# Patient Record
Sex: Male | Born: 1960 | Race: White | Hispanic: No | Marital: Single | Smoking: Never smoker
Health system: Southern US, Community
[De-identification: ages and names within clinical notes are randomized; demographics above are authoritative.]

## PROBLEM LIST (undated history)

## (undated) DIAGNOSIS — I10 Essential (primary) hypertension: Secondary | ICD-10-CM

## (undated) HISTORY — DX: Essential (primary) hypertension: I10

---

## 1997-07-16 ENCOUNTER — Encounter: Admission: RE | Admit: 1997-07-16 | Discharge: 1997-07-16 | Payer: Self-pay | Admitting: Internal Medicine

## 1997-09-08 ENCOUNTER — Encounter: Admission: RE | Admit: 1997-09-08 | Discharge: 1997-09-08 | Payer: Self-pay | Admitting: Internal Medicine

## 1997-12-14 ENCOUNTER — Ambulatory Visit (HOSPITAL_COMMUNITY): Admission: RE | Admit: 1997-12-14 | Discharge: 1997-12-14 | Payer: Self-pay | Admitting: Internal Medicine

## 1997-12-14 ENCOUNTER — Encounter: Admission: RE | Admit: 1997-12-14 | Discharge: 1997-12-14 | Payer: Self-pay | Admitting: Internal Medicine

## 1998-02-08 ENCOUNTER — Encounter: Admission: RE | Admit: 1998-02-08 | Discharge: 1998-02-08 | Payer: Self-pay | Admitting: Internal Medicine

## 1998-02-15 ENCOUNTER — Encounter: Admission: RE | Admit: 1998-02-15 | Discharge: 1998-02-15 | Payer: Self-pay | Admitting: Hematology and Oncology

## 1999-11-11 ENCOUNTER — Emergency Department (HOSPITAL_COMMUNITY): Admission: EM | Admit: 1999-11-11 | Discharge: 1999-11-11 | Payer: Self-pay | Admitting: Emergency Medicine

## 2001-01-30 ENCOUNTER — Encounter: Admission: RE | Admit: 2001-01-30 | Discharge: 2001-01-30 | Payer: Self-pay | Admitting: Internal Medicine

## 2001-02-28 ENCOUNTER — Encounter: Admission: RE | Admit: 2001-02-28 | Discharge: 2001-02-28 | Payer: Self-pay | Admitting: Internal Medicine

## 2001-06-10 ENCOUNTER — Emergency Department (HOSPITAL_COMMUNITY): Admission: EM | Admit: 2001-06-10 | Discharge: 2001-06-10 | Payer: Self-pay | Admitting: Emergency Medicine

## 2002-05-20 ENCOUNTER — Encounter: Admission: RE | Admit: 2002-05-20 | Discharge: 2002-05-20 | Payer: Self-pay | Admitting: Internal Medicine

## 2006-09-26 ENCOUNTER — Encounter: Admission: RE | Admit: 2006-09-26 | Discharge: 2006-09-26 | Payer: Self-pay | Admitting: Orthopedic Surgery

## 2010-08-02 ENCOUNTER — Other Ambulatory Visit: Payer: Self-pay | Admitting: Family Medicine

## 2010-08-02 ENCOUNTER — Ambulatory Visit
Admission: RE | Admit: 2010-08-02 | Discharge: 2010-08-02 | Disposition: A | Discharge status: Home / Self Care | Payer: Federal, State, Local not specified - PPO | Source: Ambulatory Visit | Attending: Family Medicine | Admitting: Family Medicine

## 2010-08-02 DIAGNOSIS — M79669 Pain in unspecified lower leg: Secondary | ICD-10-CM

## 2013-10-07 ENCOUNTER — Encounter (INDEPENDENT_AMBULATORY_CARE_PROVIDER_SITE_OTHER): Payer: Self-pay | Admitting: General Surgery

## 2013-10-16 ENCOUNTER — Ambulatory Visit (INDEPENDENT_AMBULATORY_CARE_PROVIDER_SITE_OTHER): Payer: Federal, State, Local not specified - PPO | Admitting: General Surgery

## 2013-10-16 ENCOUNTER — Encounter (INDEPENDENT_AMBULATORY_CARE_PROVIDER_SITE_OTHER): Payer: Self-pay | Admitting: General Surgery

## 2013-10-16 VITALS — BP 129/84 | HR 86 | Temp 97.7°F | Resp 16 | Ht 72.0 in | Wt 186.6 lb

## 2013-10-16 DIAGNOSIS — K429 Umbilical hernia without obstruction or gangrene: Secondary | ICD-10-CM

## 2013-10-16 NOTE — Progress Notes (Signed)
Patient ID: Dave Lyons, male   DOB: 03/16/1961, 53 y.o.   MRN: [id number]  Chief Complaint  Patient presents with  . Hernia    HPI Dave Lyons is a 53 y.o. male.  He is referred by Dr. Johnn HaiSharon Walters for evaluation of an umbilical hernia.   This gentleman is very fit. Works out regularly. No major medical problems. He and his girlfriend noticed a bulge at his umbilicus and he can feel it squishing it out although bit. No pain. No prior history of hernia. He was fearful that something bad what happened to him  He discussed this with his sister who [place] an M[place]D. [place]. HPI  Past Medical History  Diagnosis Date  . Hypertension     History reviewed. No pertinent past surgical history.  History reviewed. No pertinent family history.  Social History History  Substance Use Topics  . Smoking status: Never Smoker   . Smokeless tobacco: Not on file  . Alcohol Use: Yes     Comment: 11/week beer    No Known Allergies  Current Outpatient Prescriptions  Medication Sig Dispense Refill  . b complex vitamins tablet Take 1 tablet by mouth daily.      . Multiple Vitamin (MULTIVITAMIN) LIQD Take 5 mLs by mouth daily.      . Omega-3 Fatty Acids (FISH OIL) 1000 MG CAPS Take by mouth.       No current facility-administered medications for this visit.    Review of Systems Review of Systems  Constitutional: Negative for fever, chills and unexpected weight change.  HENT: Negative for congestion, hearing loss, sore throat, trouble swallowing and voice change.   Eyes: Negative for visual disturbance.  Respiratory: Negative for cough and wheezing.   Cardiovascular: Negative for chest pain, palpitations and leg swelling.  Gastrointestinal: Negative for nausea, vomiting, abdominal pain, diarrhea, constipation, blood in stool, abdominal distention, anal bleeding and rectal pain.  Genitourinary: Negative for hematuria and difficulty urinating.  Musculoskeletal: Negative for  arthralgias.  Skin: Negative for rash and wound.  Neurological: Negative for seizures, syncope, weakness and headaches.  Hematological: Negative for adenopathy. Does not bruise/bleed easily.  Psychiatric/Behavioral: Negative for confusion.    Blood pressure 129/84, pulse 86, temperature 97.7 F (36.5 C), temperature source Temporal, resp. rate 16, height 6' (1.829 m), weight 186 lb 9.6 oz (84.641 kg).  Physical Exam Physical Exam  Constitutional: He is oriented to person, place, and time. He appears well-developed and well-nourished. No distress.  Healthy. Appears fit for his age.  HENT:  Head: Normocephalic.  Nose: Nose normal.  Mouth/Throat: No oropharyngeal exudate.  Eyes: Conjunctivae and EOM are normal. Pupils are equal, round, and reactive to light. Right eye exhibits no discharge. Left eye exhibits no discharge. No scleral icterus.  Neck: Normal range of motion. Neck supple. No JVD present. No tracheal deviation present. No thyromegaly present.  Cardiovascular: Normal rate, regular rhythm, normal heart sounds and intact distal pulses.   No murmur heard. Pulmonary/Chest: Effort normal and breath sounds normal. No stridor. No respiratory distress. He has no wheezes. He has no rales. He exhibits no tenderness.  Abdominal: Soft. Bowel sounds are normal. He exhibits no distension and no mass. There is no tenderness. There is no rebound and no guarding.  Tiny umbilical hernia. Protrusion superiorly. Partially reducible. Defect 1 cm or less. Skin healthy.  Musculoskeletal: Normal range of motion. He exhibits no edema and no tenderness.  Lymphadenopathy:    He has no cervical adenopathy.  Neurological: He  is alert and oriented to person, place, and time. He has normal reflexes. Coordination normal.  Skin: Skin is warm and dry. No rash noted. He is not diaphoretic. No erythema. No pallor.  Psychiatric: He has a normal mood and affect. His behavior is normal. Judgment and thought content  normal.    Data Reviewed Office notes from needle at [facility]  Assessment    Asymptomatic, partially incarcerated umbilical hernia.     Plan    We had a long talk about the anatomy of his hernia, the likelihood that there is no immediate danger, the natural history of progression, usually slow and gradual, much less likely sudden obstruction.  He is not interested in having this repaired at this time, which is very reasonable.  I told him to return to discuss repair if he develops enlargement or pain.        Judith Campillo M 10/16/2013, 3:05 PM

## 2013-10-16 NOTE — Patient Instructions (Signed)
You have a very tiny umbilical hernia with a small amount of fatty tissue protruding through this. I can partially push this fatty tissue backing out.  You are not having any pain. You are not in any immediate danger.  I would advise you to have the hernia repaired when it becomes larger or becomes painful.  Return to see Dr. Derrell Lolling as needed or if the hernia progresses.      Umbilical Herniorrhaphy Herniorrhaphy is surgery to repair a hernia. A hernia is the protrusion of a part of an organ through an abdominal opening. An umbilical hernia means that your hernia is in the area around your navel. If the hernia is not repaired, the gap could get bigger. Your intestines or other tissues, such as fat, could get trapped in the gap. This can lead to other health problems, such as blocked intestines. If the hernia is fixed before problems set in, you may be allowed to go home the same day as the surgery (outpatient). LET YOUR CAREGIVER KNOW ABOUT:  Allergies to food or medicine.  Medicines taken, including vitamins, herbs, eyedrops, over-the-counter medicines, and creams.  Use of steroids (by mouth or creams).  Previous problems with anesthetics or numbing medicines.  History of bleeding problems or blood clots.  Previous surgery.  Other health problems, including diabetes and kidney problems.  Possibility of pregnancy, if this applies. RISKS AND COMPLICATIONS  Pain.  Excessive bleeding.  Hematoma. This is a pocket of blood that collects under the surgery site.  Infection at the surgery site.  Numbness at the surgery site.  Swelling and bruising.  Blood clots.  Intestinal damage (rare).  Scarring.  Skin damage.  Development of another hernia. This may require another surgery. BEFORE THE PROCEDURE  Ask your caregiver about changing or stopping your regular medicines. You may need to stop taking aspirin, nonsteroidal anti-inflammatory drugs (NSAIDs), vitamin E, and  blood thinners as early as 2 weeks before the procedure.  Do not eat or drink for 8 hours before the procedure, or as directed by your caregiver.  You might be asked to shower or wash with an antibacterial soap before the procedure.  Wear comfortable clothes that will be easy to put on after the procedure. PROCEDURE You will be given an intravenous (IV) tube. A needle will be inserted in your arm. Medicine will flow directly into your body through this needle. You might be given medicine to help you relax (sedative). You will be given medicine that numbs the area (local anesthetic) or medicine that makes you sleep (general anesthetic). If you have open surgery:  The surgeon will make a cut (incision) in your abdomen.  The gap in the muscle wall will be repaired. The surgeon may sew the edges together over the gap or use a mesh material to strengthen the area. When mesh is used, the body grows new, strong tissue into and around it. This new tissue closes the gap.  A drain might be put in to remove excess fluid from the body after surgery.  The surgeon will close the incision with stitches, glue, or staples. If you have laparoscopic surgery:  The surgeon will make several small incisions in your abdomen.  A thin, lighted tube (laparoscope) will be inserted into the abdomen through an incision. A camera is attached to the laparoscope that allows the surgeon to see inside the abdomen.  Tools will be inserted through the other incisions to repair the hernia. Usually, mesh is used to cover the  gap.  The surgeon will close the incisions with stitches. AFTER THE PROCEDURE  You will be taken to a recovery area. A nurse will watch and check your progress.  When you are awake, feeling well, and taking fluids well, you may be allowed to go home. In some cases, you may need to stay overnight in the hospital.  Arrange for someone to drive you home. Document Released: 06/22/2008 Document Revised:  09/25/2011 Document Reviewed: 06/27/2011 [facility] Patient Information 2015 [employer] [place]. This information is not intended to replace advice given to you by your health care provider. Make sure you discuss any questions you have with your health care provider.

## 2013-10-22 ENCOUNTER — Ambulatory Visit (INDEPENDENT_AMBULATORY_CARE_PROVIDER_SITE_OTHER): Payer: Federal, State, Local not specified - PPO | Admitting: General Surgery

## 2013-11-05 ENCOUNTER — Ambulatory Visit (INDEPENDENT_AMBULATORY_CARE_PROVIDER_SITE_OTHER): Payer: Federal, State, Local not specified - PPO | Admitting: General Surgery

## 2017-06-12 DIAGNOSIS — R52 Pain, unspecified: Secondary | ICD-10-CM | POA: Diagnosis not present

## 2017-06-12 DIAGNOSIS — J111 Influenza due to unidentified influenza virus with other respiratory manifestations: Secondary | ICD-10-CM | POA: Diagnosis not present

## 2017-11-07 DIAGNOSIS — Z0000 Encounter for general adult medical examination without abnormal findings: Secondary | ICD-10-CM | POA: Diagnosis not present

## 2017-11-07 DIAGNOSIS — E782 Mixed hyperlipidemia: Secondary | ICD-10-CM | POA: Diagnosis not present

## 2017-11-07 DIAGNOSIS — Z125 Encounter for screening for malignant neoplasm of prostate: Secondary | ICD-10-CM | POA: Diagnosis not present

## 2018-01-15 DIAGNOSIS — E785 Hyperlipidemia, unspecified: Secondary | ICD-10-CM | POA: Diagnosis not present

## 2018-03-02 DIAGNOSIS — S93401A Sprain of unspecified ligament of right ankle, initial encounter: Secondary | ICD-10-CM | POA: Diagnosis not present

## 2018-12-30 DIAGNOSIS — E785 Hyperlipidemia, unspecified: Secondary | ICD-10-CM | POA: Diagnosis not present

## 2018-12-30 DIAGNOSIS — Z125 Encounter for screening for malignant neoplasm of prostate: Secondary | ICD-10-CM | POA: Diagnosis not present

## 2018-12-30 DIAGNOSIS — Z23 Encounter for immunization: Secondary | ICD-10-CM | POA: Diagnosis not present

## 2019-01-01 DIAGNOSIS — Z0000 Encounter for general adult medical examination without abnormal findings: Secondary | ICD-10-CM | POA: Diagnosis not present

## 2019-01-08 DIAGNOSIS — Z23 Encounter for immunization: Secondary | ICD-10-CM | POA: Diagnosis not present

## 2019-02-17 DIAGNOSIS — L738 Other specified follicular disorders: Secondary | ICD-10-CM | POA: Diagnosis not present

## 2019-02-17 DIAGNOSIS — D210 Benign neoplasm of connective and other soft tissue of head, face and neck: Secondary | ICD-10-CM | POA: Diagnosis not present

## 2019-02-17 DIAGNOSIS — D485 Neoplasm of uncertain behavior of skin: Secondary | ICD-10-CM | POA: Diagnosis not present

## 2019-02-17 DIAGNOSIS — L573 Poikiloderma of Civatte: Secondary | ICD-10-CM | POA: Diagnosis not present

## 2019-03-13 DIAGNOSIS — Z1159 Encounter for screening for other viral diseases: Secondary | ICD-10-CM | POA: Diagnosis not present

## 2019-03-18 DIAGNOSIS — K5730 Diverticulosis of large intestine without perforation or abscess without bleeding: Secondary | ICD-10-CM | POA: Diagnosis not present

## 2019-03-18 DIAGNOSIS — K635 Polyp of colon: Secondary | ICD-10-CM | POA: Diagnosis not present

## 2019-03-18 DIAGNOSIS — Z1211 Encounter for screening for malignant neoplasm of colon: Secondary | ICD-10-CM | POA: Diagnosis not present

## 2019-03-18 DIAGNOSIS — Z8371 Family history of colonic polyps: Secondary | ICD-10-CM | POA: Diagnosis not present

## 2019-03-18 DIAGNOSIS — K5140 Inflammatory polyps of colon without complications: Secondary | ICD-10-CM | POA: Diagnosis not present

## 2019-04-30 DIAGNOSIS — M545 Low back pain: Secondary | ICD-10-CM | POA: Diagnosis not present

## 2019-05-07 DIAGNOSIS — M545 Low back pain: Secondary | ICD-10-CM | POA: Diagnosis not present

## 2019-05-12 DIAGNOSIS — Z23 Encounter for immunization: Secondary | ICD-10-CM | POA: Diagnosis not present

## 2019-09-02 DIAGNOSIS — S80861A Insect bite (nonvenomous), right lower leg, initial encounter: Secondary | ICD-10-CM | POA: Diagnosis not present

## 2019-09-02 DIAGNOSIS — L740 Miliaria rubra: Secondary | ICD-10-CM | POA: Diagnosis not present

## 2019-10-07 DIAGNOSIS — L738 Other specified follicular disorders: Secondary | ICD-10-CM | POA: Diagnosis not present

## 2019-10-07 DIAGNOSIS — L821 Other seborrheic keratosis: Secondary | ICD-10-CM | POA: Diagnosis not present

## 2019-10-07 DIAGNOSIS — D225 Melanocytic nevi of trunk: Secondary | ICD-10-CM | POA: Diagnosis not present

## 2020-01-21 DIAGNOSIS — E782 Mixed hyperlipidemia: Secondary | ICD-10-CM | POA: Diagnosis not present

## 2020-01-21 DIAGNOSIS — Z125 Encounter for screening for malignant neoplasm of prostate: Secondary | ICD-10-CM | POA: Diagnosis not present

## 2020-01-21 DIAGNOSIS — Z23 Encounter for immunization: Secondary | ICD-10-CM | POA: Diagnosis not present

## 2020-01-21 DIAGNOSIS — Z0000 Encounter for general adult medical examination without abnormal findings: Secondary | ICD-10-CM | POA: Diagnosis not present

## 2020-03-21 DIAGNOSIS — J22 Unspecified acute lower respiratory infection: Secondary | ICD-10-CM | POA: Diagnosis not present

## 2020-03-21 DIAGNOSIS — U071 COVID-19: Secondary | ICD-10-CM | POA: Diagnosis not present

## 2020-03-22 DIAGNOSIS — J22 Unspecified acute lower respiratory infection: Secondary | ICD-10-CM | POA: Diagnosis not present

## 2020-03-22 DIAGNOSIS — U071 COVID-19: Secondary | ICD-10-CM | POA: Diagnosis not present

## 2020-03-25 ENCOUNTER — Telehealth: Payer: Self-pay | Admitting: Physician Assistant

## 2020-03-25 NOTE — Telephone Encounter (Signed)
Called to discuss with Dave Lyons about Covid symptoms and the use of  monoclonal antibody infusion for those with mild to moderate Covid symptoms and at a high risk of hospitalization.     Pt is not qualified for this infusion due to lack of identified risk factors and co-morbid conditions.  Symptoms reviewed as well as criteria for ending isolation.  Symptoms reviewed that would warrant ED/Hospital evaluation as well should her condition worsen. Preventative practices reviewed. Patient verbalized understanding.      Patient Active Problem List   Diagnosis Date Noted  . Umbilical hernia without obstruction and without gangrene 10/16/2013     Manson Passey, PA

## 2020-03-30 DIAGNOSIS — Z1152 Encounter for screening for COVID-19: Secondary | ICD-10-CM | POA: Diagnosis not present

## 2020-03-30 DIAGNOSIS — Z03818 Encounter for observation for suspected exposure to other biological agents ruled out: Secondary | ICD-10-CM | POA: Diagnosis not present

## 2020-05-26 DIAGNOSIS — R002 Palpitations: Secondary | ICD-10-CM | POA: Diagnosis not present

## 2020-05-26 DIAGNOSIS — R519 Headache, unspecified: Secondary | ICD-10-CM | POA: Diagnosis not present

## 2020-05-26 DIAGNOSIS — Z8616 Personal history of COVID-19: Secondary | ICD-10-CM | POA: Diagnosis not present

## 2020-05-26 DIAGNOSIS — I10 Essential (primary) hypertension: Secondary | ICD-10-CM | POA: Diagnosis not present

## 2020-06-01 ENCOUNTER — Ambulatory Visit (INDEPENDENT_AMBULATORY_CARE_PROVIDER_SITE_OTHER): Payer: Federal, State, Local not specified - PPO | Admitting: Nurse Practitioner

## 2020-06-01 VITALS — BP 139/80 | HR 85 | Temp 97.5°F | Resp 18 | Ht 72.0 in | Wt 196.0 lb

## 2020-06-01 DIAGNOSIS — F419 Anxiety disorder, unspecified: Secondary | ICD-10-CM

## 2020-06-01 DIAGNOSIS — Z8616 Personal history of COVID-19: Secondary | ICD-10-CM

## 2020-06-01 DIAGNOSIS — R000 Tachycardia, unspecified: Secondary | ICD-10-CM | POA: Diagnosis not present

## 2020-06-01 DIAGNOSIS — R002 Palpitations: Secondary | ICD-10-CM | POA: Diagnosis not present

## 2020-06-01 DIAGNOSIS — I499 Cardiac arrhythmia, unspecified: Secondary | ICD-10-CM

## 2020-06-01 MED ORDER — HYDROXYZINE HCL 10 MG PO TABS: 10.0000 mg | ORAL_TABLET | Freq: Three times a day (TID) | ORAL | 0 refills | Status: DC | PRN

## 2020-06-01 NOTE — Patient Instructions (Signed)
History of Covid 19 Tachycardia Elevated blood pressure Palpitations Headaches Irregular heart rhythm:   Stay well hydrated  Stay active  Deep breathing exercises  May start vitamin C daily, vitamin D3 daily, Zinc daily  May take tylenol or fever or pain  May take mucinex twice daily  Will order atarax PRN for anxiety  May trial melatonin 1 hour before bedtime  Will place referral to EP - further evaluation - concerned for POTS     Follow up:  Follow up in 2 weeks or sooner if needed

## 2020-06-01 NOTE — Progress Notes (Signed)
Patient has taken supplements today and has eaten lunch. Patient denies pain at this time. Patient complains of elevated BP since contracting COVID. Patient denies HA, body aches. Patient denies N/V/diarrhea. Patient fevers or low appetite.

## 2020-06-01 NOTE — Progress Notes (Signed)
@Patient  ID: , male    DOB: April 16, 1960, 60 y.o.   MRN: [id number]  Chief Complaint  Patient presents with  . Covid Positive    Long hauler concerns relating to BP    Referring provider: No ref. provider found  HPI  Patient presents today for post COVID care clinic visit.  Patient tested positive for Covid on 03/19/2020.  Patient states that she is still having issues of headache, congestion, agitation, palpitations.  Patient also has concerns related to blood pressure and heart rate.  Patient did have an EKG in office today which showed normal sinus rhythm.  Upon my exam patient was noted have a regular heart rhythm but this was not captured during EKG.  We discussed that patient does need further work-up with cardiology and may need a Zio patch.  Concerned for POTS and will place referral for EP. Denies f/c/s, n/v/d, hemoptysis, PND, chest pain or edema.     No Known Allergies   There is no immunization history on file for this patient.  Past Medical History:  Diagnosis Date  . Hypertension     Tobacco History: Social History   Tobacco Use  Smoking Status Never Smoker  Smokeless Tobacco Never Used   Counseling given: Not Answered   Outpatient Encounter Medications as of 06/01/2020  Medication Sig  . b complex vitamins tablet Take 1 tablet by mouth daily.  . Coenzyme Q10 (CO Q 10) 10 MG CAPS 1 capsule with a meal  . hydrOXYzine (ATARAX/VISTARIL) 10 MG tablet Take 1 tablet (10 mg total) by mouth 3 (three) times daily as needed.  . Multiple Vitamin (MULTIVITAMIN) LIQD Take 5 mLs by mouth daily.  . niacin 500 MG tablet 1 tablet with food  . Omega-3 Fatty Acids (FISH OIL) 1000 MG CAPS Take by mouth.   No facility-administered encounter medications on file as of 06/01/2020.     Review of Systems  Review of Systems  Constitutional: Negative.  Negative for fatigue and fever.  HENT: Positive for congestion.   Respiratory: Negative for cough and shortness of  breath.   Cardiovascular: Positive for palpitations.  Gastrointestinal: Negative.   Allergic/Immunologic: Negative.   Neurological: Positive for headaches.  Psychiatric/Behavioral: The patient is nervous/anxious.        Physical Exam  BP 139/80 (BP Location: Right Arm, Patient Position: Sitting, Cuff Size: Large)   Pulse 85   Temp (!) 97.5 F (36.4 C) (Oral)   Resp 18   Ht 6' (1.829 m)   Wt 196 lb (88.9 kg)   SpO2 99%   BMI 26.58 kg/m   Wt Readings from Last 5 Encounters:  06/01/20 196 lb (88.9 kg)  10/16/13 186 lb 9.6 oz (84.6 kg)     Physical Exam Vitals and nursing note reviewed.  Constitutional:      General: He is not in acute distress.    Appearance: He is well-developed and well-nourished.  Cardiovascular:     Rate and Rhythm: Normal rate. Rhythm irregular.  Pulmonary:     Effort: Pulmonary effort is normal.     Breath sounds: Normal breath sounds.  Musculoskeletal:     Right lower leg: No edema.     Left lower leg: No edema.  Skin:    General: Skin is warm and dry.  Neurological:     Mental Status: He is alert and oriented to person, place, and time.  Psychiatric:        Mood and Affect: Mood and affect and mood  normal.        Behavior: Behavior normal.      Lab Results:  CBC No results found for: WBC, RBC, HGB, HCT, PLT, MCV, MCH, MCHC, RDW, LYMPHSABS, MONOABS, EOSABS, BASOSABS  BMET No results found for: NA, K, CL, CO2, GLUCOSE, BUN, CREATININE, CALCIUM, GFRNONAA, GFRAA  BNP No results found for: BNP  ProBNP No results found for: PROBNP  Imaging: No results found.   Assessment & Plan:   History of COVID-19 Tachycardia Elevated blood pressure Palpitations Headaches Irregular heart rhythm:   Stay well hydrated  Stay active  Deep breathing exercises  May start vitamin C daily, vitamin D3 daily, Zinc daily  May take tylenol or fever or pain  May take mucinex twice daily  Will order atarax PRN for anxiety  May trial  melatonin 1 hour before bedtime  Will place referral to EP - further evaluation - concerned for POTS     Follow up:  Follow up in 2 weeks or sooner if needed      Ivonne Andrew, NP 06/02/2020

## 2020-06-02 DIAGNOSIS — R002 Palpitations: Secondary | ICD-10-CM | POA: Insufficient documentation

## 2020-06-02 DIAGNOSIS — Z8616 Personal history of COVID-19: Secondary | ICD-10-CM | POA: Insufficient documentation

## 2020-06-02 DIAGNOSIS — I499 Cardiac arrhythmia, unspecified: Secondary | ICD-10-CM | POA: Insufficient documentation

## 2020-06-02 DIAGNOSIS — F419 Anxiety disorder, unspecified: Secondary | ICD-10-CM | POA: Insufficient documentation

## 2020-06-02 DIAGNOSIS — R000 Tachycardia, unspecified: Secondary | ICD-10-CM | POA: Insufficient documentation

## 2020-06-02 NOTE — Assessment & Plan Note (Signed)
Tachycardia Elevated blood pressure Palpitations Headaches Irregular heart rhythm:   Stay well hydrated  Stay active  Deep breathing exercises  May start vitamin C daily, vitamin D3 daily, Zinc daily  May take tylenol or fever or pain  May take mucinex twice daily  Will order atarax PRN for anxiety  May trial melatonin 1 hour before bedtime  Will place referral to EP - further evaluation - concerned for POTS     Follow up:  Follow up in 2 weeks or sooner if needed

## 2020-06-09 ENCOUNTER — Telehealth: Payer: Self-pay

## 2020-06-09 NOTE — Telephone Encounter (Signed)
EKG ON FILE °

## 2020-07-13 ENCOUNTER — Ambulatory Visit: Payer: Federal, State, Local not specified - PPO | Admitting: Internal Medicine

## 2020-07-13 ENCOUNTER — Other Ambulatory Visit: Payer: Self-pay

## 2020-07-13 ENCOUNTER — Encounter: Payer: Self-pay | Admitting: Internal Medicine

## 2020-07-13 VITALS — Ht 72.0 in | Wt 199.0 lb

## 2020-07-13 DIAGNOSIS — R002 Palpitations: Secondary | ICD-10-CM | POA: Diagnosis not present

## 2020-07-13 NOTE — Patient Instructions (Addendum)
Medication Instructions:  Your physician recommends that you continue on your current medications as directed. Please refer to the Current Medication list given to you today.  Labwork: None ordered.  Testing/Procedures: None ordered.  Follow-Up: Your physician wants you to follow-up in: as needed with Lewayne Bunting, MD or one of the following Advanced Practice Providers on your designated Care Team:    Gypsy Balsam, NP  Francis Dowse, PA-C  Casimiro Needle "Mardelle Matte" Lanna Poche, [place]   Any Other Special Instructions Will Be Listed Below (If Applicable).  If you need a refill on your cardiac medications before your next appointment, please call your pharmacy.

## 2020-07-13 NOTE — Progress Notes (Signed)
HPI Dave Lyons is referred today by Dave Seller, Dave Lyons for tachycardia. He is a 60 yo man with recent Covid infection, who has had some persistent symptoms. He was noted to have an irregular heart beat in the providers office though the ECG obtained demonstrates NSR. His main complaint was that he would have a HA since his Covid diagnosis which has improved markedly in the past 2 weeks.  Allergies  Allergen Reactions  . Benadryl [Diphenhydramine] Other (See Comments)    Pt states "it makes him feel acclerated and irritated"     Current Outpatient Medications  Medication Sig Dispense Refill  . Coenzyme Q10 (CO Q 10) 10 MG CAPS 1 capsule with a meal    . Multiple Vitamin (MULTIVITAMIN) LIQD Take 5 mLs by mouth daily.    . niacin 500 MG tablet 1 tablet with food    . Omega-3 Fatty Acids (FISH OIL) 1000 MG CAPS Take 4,000 mg by mouth daily.     No current facility-administered medications for this visit.     Past Medical History:  Diagnosis Date  . Hypertension     ROS:   All systems reviewed and negative except as noted in the HPI.   No past surgical history on file.   No family history on file.   Social History   Socioeconomic History  . Marital status: Single    Spouse name: Not on file  . Number of children: Not on file  . Years of education: Not on file  . Highest education level: Not on file  Occupational History  . Not on file  Tobacco Use  . Smoking status: Never Smoker  . Smokeless tobacco: Never Used  Substance and Sexual Activity  . Alcohol use: Yes    Alcohol/week: 11.0 standard drinks    Types: 11 Cans of beer per week  . Drug use: No  . Sexual activity: Not Currently  Other Topics Concern  . Not on file  Social History Narrative  . Not on file   Social Determinants of Health   Financial Resource Strain: Not on file  Food Insecurity: Not on file  Transportation Needs: Not on file  Physical Activity: Not on file  Stress: Not on file   Social Connections: Not on file  Intimate Partner Violence: Not on file     Wt 199 lb (90.3 kg)   BMI 26.99 kg/m   Physical Exam:  Well appearing middle aged man, NAD HEENT: Unremarkable Neck:  No JVD, no thyromegally Lymphatics:  No adenopathy Back:  No CVA tenderness Lungs:  Clear with no wheezes HEART:  Regular rate rhythm, no murmurs, no rubs, no clicks Abd:  soft, positive bowel sounds, no organomegally, no rebound, no guarding Ext:  2 plus pulses, no edema, no cyanosis, no clubbing Skin:  No rashes no nodules Neuro:  CN II through XII intact, motor grossly intact  EKG - reviewed. NSR   Assess/Plan: 1. Recent Covid infection - I suspect that most her his symptoms are related to his prior Covid infection. He appears to be improving and I think watchful waiting is most appropriate. 2. Possible POTS - his bp did not drop and his HR increased less than 10 points going from sitting to standing. He does not have orthostasis today.  3. ETOH - I encouraged him to reduce his ETOH consumption down to 1 drink and at most 2 a day. 4. Caffeine use - I encouraged him to drink a single  cup of coffee a day.   Dave Jupin,MD 2.

## 2020-10-12 DIAGNOSIS — H5711 Ocular pain, right eye: Secondary | ICD-10-CM | POA: Diagnosis not present

## 2020-10-12 DIAGNOSIS — R519 Headache, unspecified: Secondary | ICD-10-CM | POA: Diagnosis not present

## 2020-10-13 DIAGNOSIS — R0981 Nasal congestion: Secondary | ICD-10-CM | POA: Diagnosis not present

## 2020-10-13 DIAGNOSIS — R519 Headache, unspecified: Secondary | ICD-10-CM | POA: Diagnosis not present

## 2020-10-13 DIAGNOSIS — H579 Unspecified disorder of eye and adnexa: Secondary | ICD-10-CM | POA: Diagnosis not present

## 2020-10-18 DIAGNOSIS — G500 Trigeminal neuralgia: Secondary | ICD-10-CM | POA: Diagnosis not present

## 2020-12-22 DIAGNOSIS — M25562 Pain in left knee: Secondary | ICD-10-CM | POA: Diagnosis not present

## 2021-01-07 DIAGNOSIS — M25562 Pain in left knee: Secondary | ICD-10-CM | POA: Diagnosis not present

## 2021-01-12 DIAGNOSIS — M25562 Pain in left knee: Secondary | ICD-10-CM | POA: Diagnosis not present

## 2021-01-30 DIAGNOSIS — E782 Mixed hyperlipidemia: Secondary | ICD-10-CM | POA: Diagnosis not present

## 2021-01-30 DIAGNOSIS — Z0000 Encounter for general adult medical examination without abnormal findings: Secondary | ICD-10-CM | POA: Diagnosis not present

## 2021-01-30 DIAGNOSIS — Z23 Encounter for immunization: Secondary | ICD-10-CM | POA: Diagnosis not present

## 2021-02-13 DIAGNOSIS — M25532 Pain in left wrist: Secondary | ICD-10-CM | POA: Diagnosis not present

## 2021-02-16 ENCOUNTER — Other Ambulatory Visit: Payer: Self-pay | Admitting: Family Medicine

## 2021-02-16 DIAGNOSIS — E782 Mixed hyperlipidemia: Secondary | ICD-10-CM

## 2021-03-06 DIAGNOSIS — M25532 Pain in left wrist: Secondary | ICD-10-CM | POA: Diagnosis not present

## 2021-03-10 ENCOUNTER — Ambulatory Visit
Admission: RE | Admit: 2021-03-10 | Discharge: 2021-03-10 | Disposition: A | Discharge status: Home / Self Care | Payer: No Typology Code available for payment source | Source: Ambulatory Visit | Attending: Family Medicine | Admitting: Family Medicine

## 2021-03-10 DIAGNOSIS — E782 Mixed hyperlipidemia: Secondary | ICD-10-CM

## 2021-03-23 DIAGNOSIS — J988 Other specified respiratory disorders: Secondary | ICD-10-CM | POA: Diagnosis not present

## 2021-03-23 DIAGNOSIS — R0981 Nasal congestion: Secondary | ICD-10-CM | POA: Diagnosis not present

## 2021-04-27 DIAGNOSIS — M6289 Other specified disorders of muscle: Secondary | ICD-10-CM | POA: Diagnosis not present

## 2021-05-04 DIAGNOSIS — M6289 Other specified disorders of muscle: Secondary | ICD-10-CM | POA: Diagnosis not present

## 2021-05-10 DIAGNOSIS — M6289 Other specified disorders of muscle: Secondary | ICD-10-CM | POA: Diagnosis not present

## 2021-05-24 DIAGNOSIS — M6289 Other specified disorders of muscle: Secondary | ICD-10-CM | POA: Diagnosis not present

## 2021-08-22 DIAGNOSIS — J0111 Acute recurrent frontal sinusitis: Secondary | ICD-10-CM | POA: Diagnosis not present

## 2021-08-22 DIAGNOSIS — R030 Elevated blood-pressure reading, without diagnosis of hypertension: Secondary | ICD-10-CM | POA: Diagnosis not present

## 2021-08-30 DIAGNOSIS — M6289 Other specified disorders of muscle: Secondary | ICD-10-CM | POA: Diagnosis not present

## 2022-01-10 DIAGNOSIS — M6289 Other specified disorders of muscle: Secondary | ICD-10-CM | POA: Diagnosis not present

## 2022-01-10 DIAGNOSIS — R293 Abnormal posture: Secondary | ICD-10-CM | POA: Diagnosis not present

## 2022-01-10 DIAGNOSIS — R2689 Other abnormalities of gait and mobility: Secondary | ICD-10-CM | POA: Diagnosis not present

## 2022-01-10 DIAGNOSIS — M5459 Other low back pain: Secondary | ICD-10-CM | POA: Diagnosis not present

## 2022-01-11 DIAGNOSIS — R2689 Other abnormalities of gait and mobility: Secondary | ICD-10-CM | POA: Diagnosis not present

## 2022-01-11 DIAGNOSIS — R293 Abnormal posture: Secondary | ICD-10-CM | POA: Diagnosis not present

## 2022-01-11 DIAGNOSIS — M6289 Other specified disorders of muscle: Secondary | ICD-10-CM | POA: Diagnosis not present

## 2022-01-11 DIAGNOSIS — M5459 Other low back pain: Secondary | ICD-10-CM | POA: Diagnosis not present

## 2022-01-24 DIAGNOSIS — R2689 Other abnormalities of gait and mobility: Secondary | ICD-10-CM | POA: Diagnosis not present

## 2022-01-24 DIAGNOSIS — M5459 Other low back pain: Secondary | ICD-10-CM | POA: Diagnosis not present

## 2022-01-24 DIAGNOSIS — R293 Abnormal posture: Secondary | ICD-10-CM | POA: Diagnosis not present

## 2022-01-24 DIAGNOSIS — M6289 Other specified disorders of muscle: Secondary | ICD-10-CM | POA: Diagnosis not present

## 2022-02-07 DIAGNOSIS — M5459 Other low back pain: Secondary | ICD-10-CM | POA: Diagnosis not present

## 2022-02-07 DIAGNOSIS — R293 Abnormal posture: Secondary | ICD-10-CM | POA: Diagnosis not present

## 2022-02-07 DIAGNOSIS — M6289 Other specified disorders of muscle: Secondary | ICD-10-CM | POA: Diagnosis not present

## 2022-02-07 DIAGNOSIS — R2689 Other abnormalities of gait and mobility: Secondary | ICD-10-CM | POA: Diagnosis not present

## 2022-02-19 DIAGNOSIS — E785 Hyperlipidemia, unspecified: Secondary | ICD-10-CM | POA: Diagnosis not present

## 2022-02-19 DIAGNOSIS — Z0000 Encounter for general adult medical examination without abnormal findings: Secondary | ICD-10-CM | POA: Diagnosis not present

## 2022-02-19 DIAGNOSIS — R7301 Impaired fasting glucose: Secondary | ICD-10-CM | POA: Diagnosis not present

## 2022-02-19 DIAGNOSIS — Z125 Encounter for screening for malignant neoplasm of prostate: Secondary | ICD-10-CM | POA: Diagnosis not present

## 2022-02-26 ENCOUNTER — Other Ambulatory Visit: Payer: Self-pay | Admitting: Family Medicine

## 2022-02-26 DIAGNOSIS — R918 Other nonspecific abnormal finding of lung field: Secondary | ICD-10-CM

## 2022-03-14 ENCOUNTER — Ambulatory Visit
Admission: RE | Admit: 2022-03-14 | Discharge: 2022-03-14 | Disposition: A | Discharge status: Home / Self Care | Payer: Federal, State, Local not specified - PPO | Source: Ambulatory Visit | Attending: Family Medicine | Admitting: Family Medicine

## 2022-03-14 DIAGNOSIS — R918 Other nonspecific abnormal finding of lung field: Secondary | ICD-10-CM

## 2022-03-14 DIAGNOSIS — J439 Emphysema, unspecified: Secondary | ICD-10-CM | POA: Diagnosis not present

## 2022-03-14 DIAGNOSIS — R911 Solitary pulmonary nodule: Secondary | ICD-10-CM | POA: Diagnosis not present

## 2022-03-20 DIAGNOSIS — J0110 Acute frontal sinusitis, unspecified: Secondary | ICD-10-CM | POA: Diagnosis not present

## 2022-05-07 DIAGNOSIS — R197 Diarrhea, unspecified: Secondary | ICD-10-CM | POA: Diagnosis not present

## 2022-05-30 ENCOUNTER — Other Ambulatory Visit: Payer: Self-pay | Admitting: Family Medicine

## 2022-05-30 DIAGNOSIS — K7460 Unspecified cirrhosis of liver: Secondary | ICD-10-CM

## 2022-06-19 ENCOUNTER — Other Ambulatory Visit (HOSPITAL_COMMUNITY): Payer: Self-pay | Admitting: Family Medicine

## 2022-06-19 DIAGNOSIS — K7460 Unspecified cirrhosis of liver: Secondary | ICD-10-CM

## 2022-07-03 ENCOUNTER — Ambulatory Visit (HOSPITAL_COMMUNITY)
Admission: RE | Admit: 2022-07-03 | Discharge: 2022-07-03 | Disposition: A | Discharge status: Home / Self Care | Payer: Federal, State, Local not specified - PPO | Source: Ambulatory Visit | Attending: Family Medicine | Admitting: Family Medicine

## 2022-07-03 DIAGNOSIS — K7460 Unspecified cirrhosis of liver: Secondary | ICD-10-CM | POA: Diagnosis not present

## 2022-07-03 DIAGNOSIS — R2689 Other abnormalities of gait and mobility: Secondary | ICD-10-CM | POA: Diagnosis not present

## 2022-07-03 DIAGNOSIS — M5459 Other low back pain: Secondary | ICD-10-CM | POA: Diagnosis not present

## 2022-07-03 DIAGNOSIS — R293 Abnormal posture: Secondary | ICD-10-CM | POA: Diagnosis not present

## 2022-07-18 DIAGNOSIS — R293 Abnormal posture: Secondary | ICD-10-CM | POA: Diagnosis not present

## 2022-07-18 DIAGNOSIS — M5459 Other low back pain: Secondary | ICD-10-CM | POA: Diagnosis not present

## 2022-07-18 DIAGNOSIS — R2689 Other abnormalities of gait and mobility: Secondary | ICD-10-CM | POA: Diagnosis not present

## 2022-08-21 DIAGNOSIS — J3489 Other specified disorders of nose and nasal sinuses: Secondary | ICD-10-CM | POA: Diagnosis not present

## 2023-02-20 DIAGNOSIS — M542 Cervicalgia: Secondary | ICD-10-CM | POA: Diagnosis not present

## 2023-02-20 DIAGNOSIS — M545 Low back pain, unspecified: Secondary | ICD-10-CM | POA: Diagnosis not present

## 2023-03-05 DIAGNOSIS — R7301 Impaired fasting glucose: Secondary | ICD-10-CM | POA: Diagnosis not present

## 2023-03-05 DIAGNOSIS — Z0000 Encounter for general adult medical examination without abnormal findings: Secondary | ICD-10-CM | POA: Diagnosis not present

## 2023-03-05 DIAGNOSIS — E785 Hyperlipidemia, unspecified: Secondary | ICD-10-CM | POA: Diagnosis not present

## 2023-03-05 DIAGNOSIS — Z23 Encounter for immunization: Secondary | ICD-10-CM | POA: Diagnosis not present

## 2023-03-12 DIAGNOSIS — M545 Low back pain, unspecified: Secondary | ICD-10-CM | POA: Diagnosis not present

## 2023-03-12 DIAGNOSIS — M542 Cervicalgia: Secondary | ICD-10-CM | POA: Diagnosis not present

## 2023-03-26 DIAGNOSIS — M542 Cervicalgia: Secondary | ICD-10-CM | POA: Diagnosis not present

## 2023-03-26 DIAGNOSIS — M545 Low back pain, unspecified: Secondary | ICD-10-CM | POA: Diagnosis not present

## 2023-04-16 DIAGNOSIS — M545 Low back pain, unspecified: Secondary | ICD-10-CM | POA: Diagnosis not present

## 2023-04-16 DIAGNOSIS — M542 Cervicalgia: Secondary | ICD-10-CM | POA: Diagnosis not present

## 2023-05-01 DIAGNOSIS — M545 Low back pain, unspecified: Secondary | ICD-10-CM | POA: Diagnosis not present

## 2023-05-01 DIAGNOSIS — M542 Cervicalgia: Secondary | ICD-10-CM | POA: Diagnosis not present

## 2023-05-07 DIAGNOSIS — M79674 Pain in right toe(s): Secondary | ICD-10-CM | POA: Diagnosis not present

## 2023-05-07 DIAGNOSIS — R030 Elevated blood-pressure reading, without diagnosis of hypertension: Secondary | ICD-10-CM | POA: Diagnosis not present

## 2023-05-08 DIAGNOSIS — M79671 Pain in right foot: Secondary | ICD-10-CM | POA: Diagnosis not present

## 2023-05-08 DIAGNOSIS — M2021 Hallux rigidus, right foot: Secondary | ICD-10-CM | POA: Diagnosis not present

## 2023-05-20 DIAGNOSIS — M545 Low back pain, unspecified: Secondary | ICD-10-CM | POA: Diagnosis not present

## 2023-05-20 DIAGNOSIS — M542 Cervicalgia: Secondary | ICD-10-CM | POA: Diagnosis not present

## 2023-05-23 DIAGNOSIS — I10 Essential (primary) hypertension: Secondary | ICD-10-CM | POA: Diagnosis not present

## 2023-06-25 DIAGNOSIS — I10 Essential (primary) hypertension: Secondary | ICD-10-CM | POA: Diagnosis not present

## 2023-08-05 DIAGNOSIS — I10 Essential (primary) hypertension: Secondary | ICD-10-CM | POA: Diagnosis not present

## 2023-09-11 DIAGNOSIS — I10 Essential (primary) hypertension: Secondary | ICD-10-CM | POA: Diagnosis not present

## 2023-09-11 DIAGNOSIS — E782 Mixed hyperlipidemia: Secondary | ICD-10-CM | POA: Diagnosis not present

## 2023-09-11 DIAGNOSIS — Z79899 Other long term (current) drug therapy: Secondary | ICD-10-CM | POA: Diagnosis not present

## 2023-11-04 IMAGING — CT CT CARDIAC CORONARY ARTERY CALCIUM SCORE
3 series · 14 of 20 positions shown, 16 images · non-contrast
Comparison: None

CLINICAL DATA: 60-year-old male with hyperlipidemia.

EXAM:
CT CARDIAC CORONARY ARTERY CALCIUM SCORE
TECHNIQUE: Non-contrast imaging through the heart was performed using
prospective ECG gating. Image post processing was performed on an
independent workstation, allowing for quantitative analysis of the
heart and coronary arteries. Note that this exam targets the heart
and the chest was not imaged in its entirety.

[Series 2: calcium scoring 2.00 qr36 bestdiast 71% hrt calciu · axial · 0.41mm/px · z∈[+1646,+1742]mm · 4 of 80 slices shown]
[im 16/80  vessel]
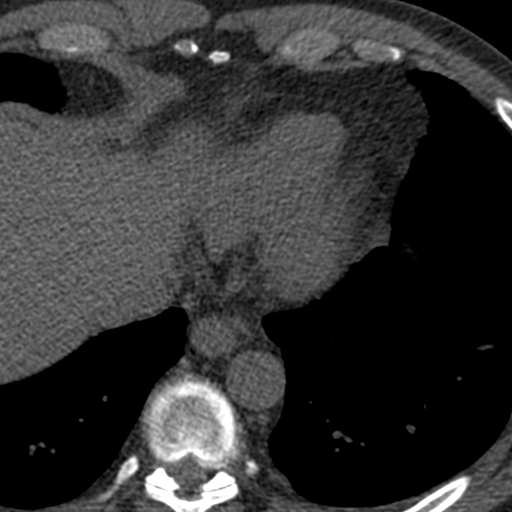
[im 32/80  vessel]
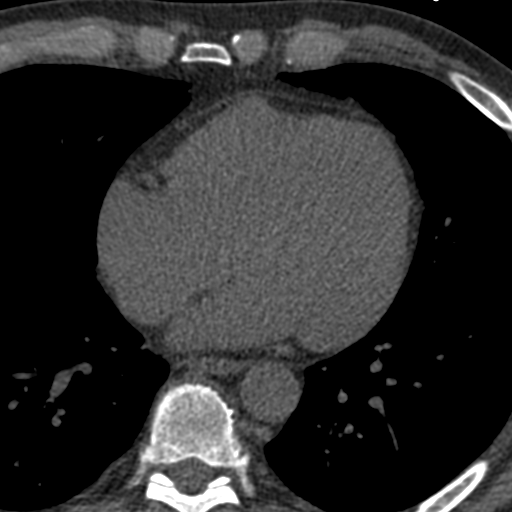
[im 48/80  vessel]
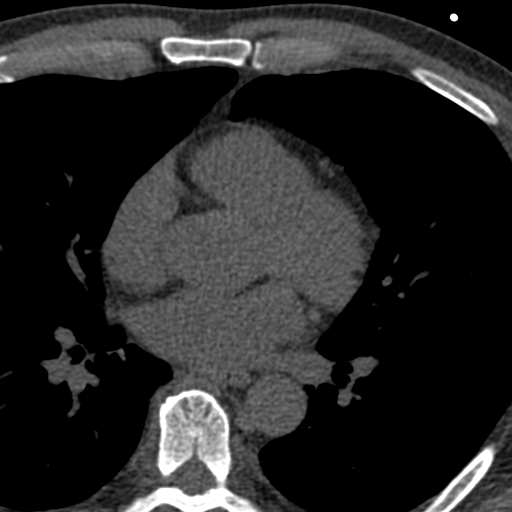
[im 64/80  vessel]
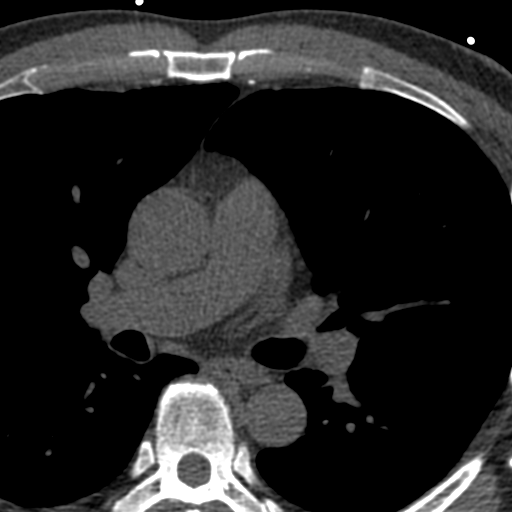

[Series 3: calcium scoring 2.00 br40 bestdiast 71% axial · axial · 0.55mm/px · z∈[+1642,+1746]mm · 5 of 80 slices shown, 7 images]
[im 14/80  vessel]
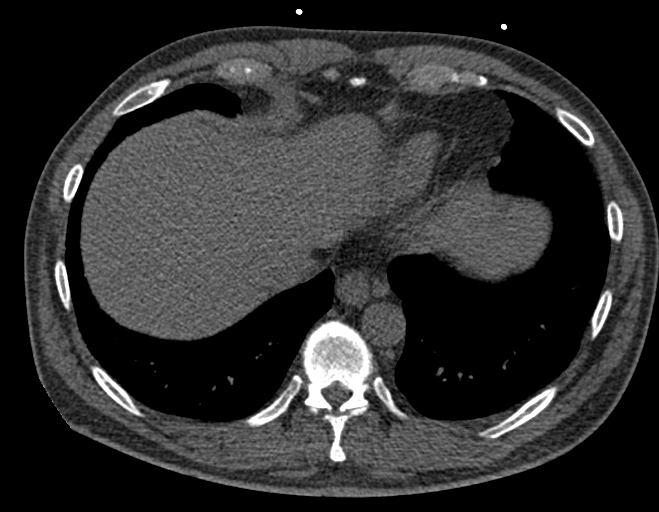
[im 14/80  lung]
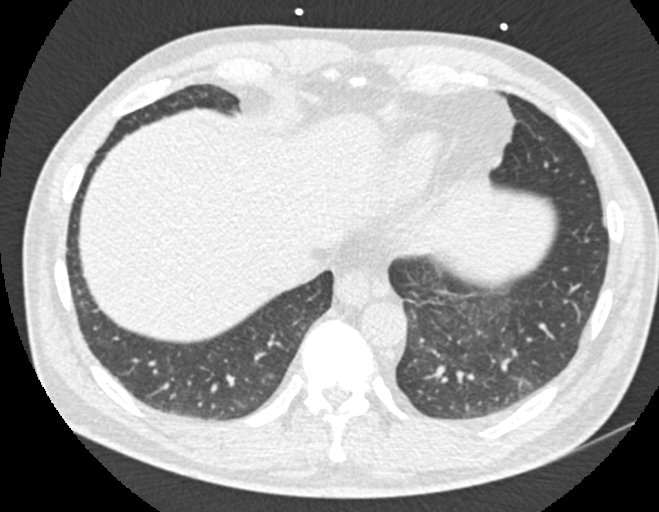
[im 27/80  vessel]
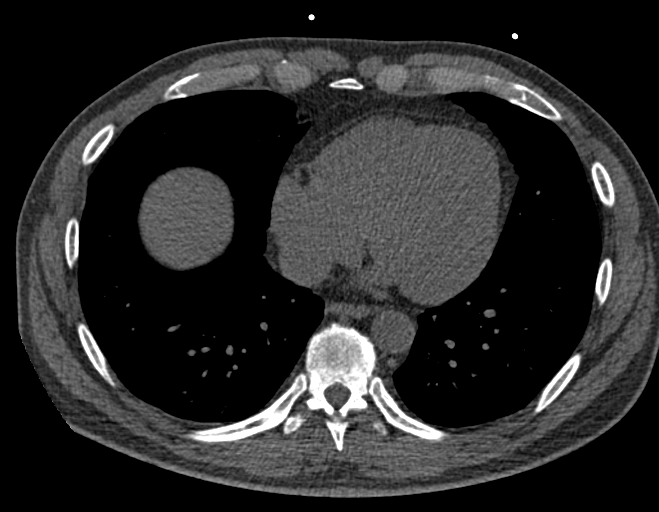
[im 40/80  vessel]
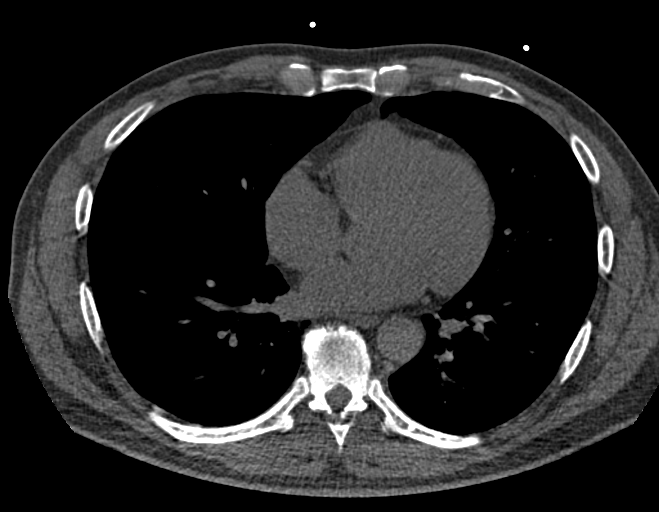
[im 53/80  vessel]
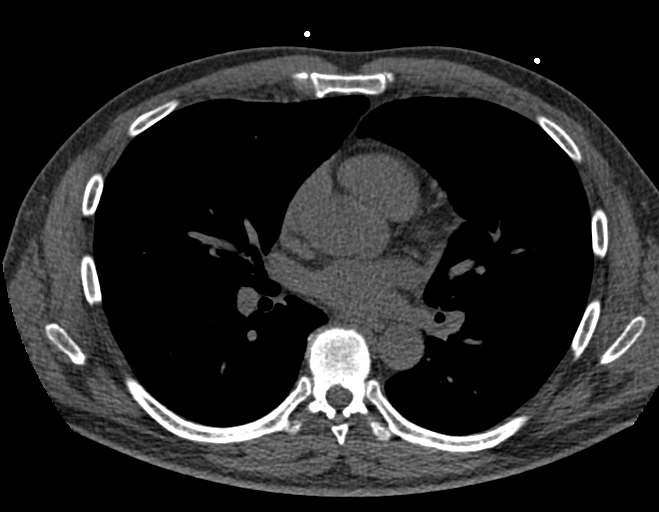
[im 66/80  vessel]
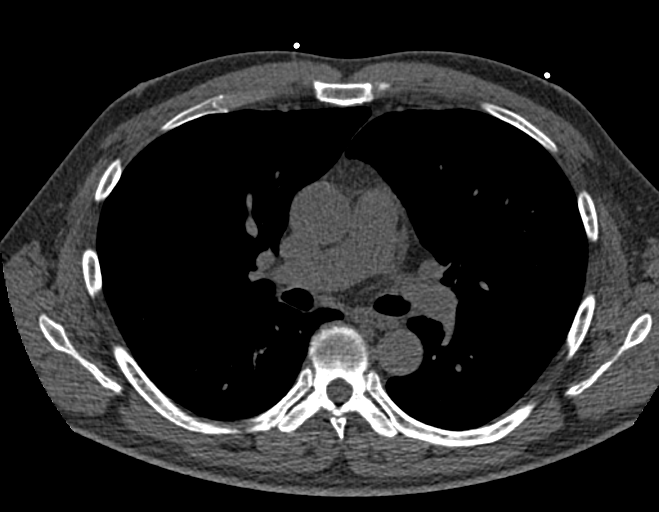
[im 66/80  lung]
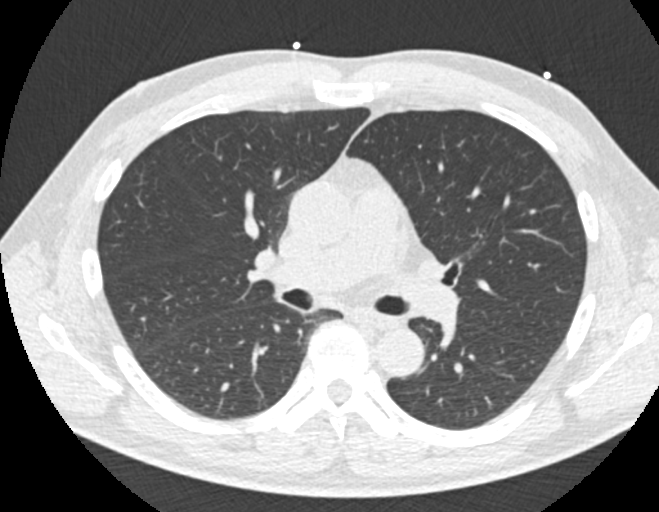

[Series 9: calcium scoring 2.00 br60 bestdiast 71% lungs · axial · 0.55mm/px · z∈[+1642,+1746]mm · 5 of 80 slices shown]
[im 14/80  vessel]
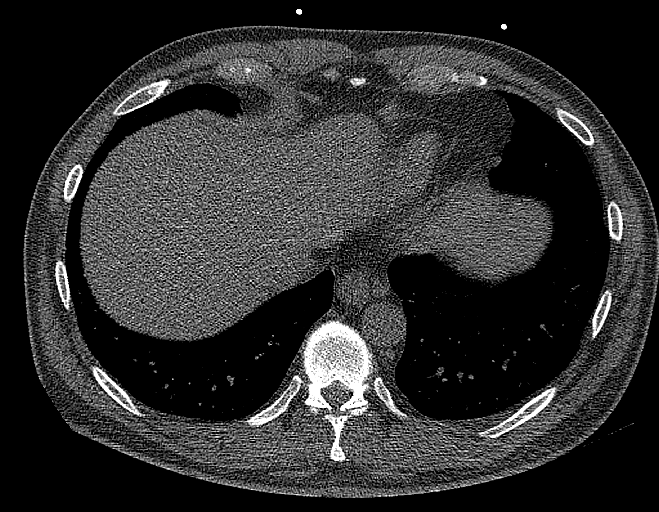
[im 27/80  vessel]
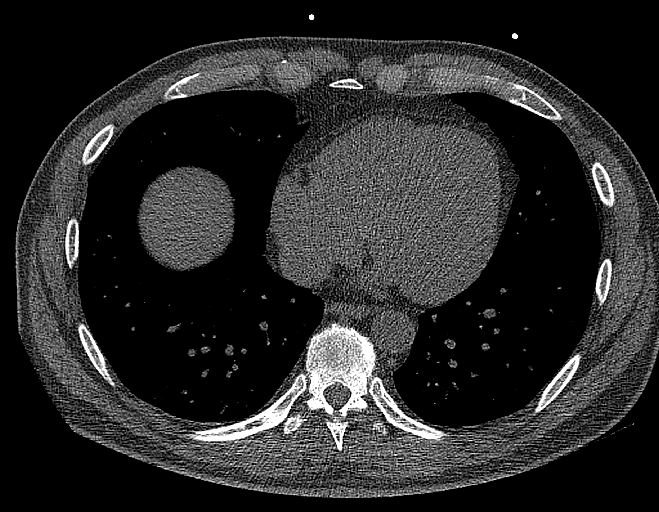
[im 40/80  vessel]
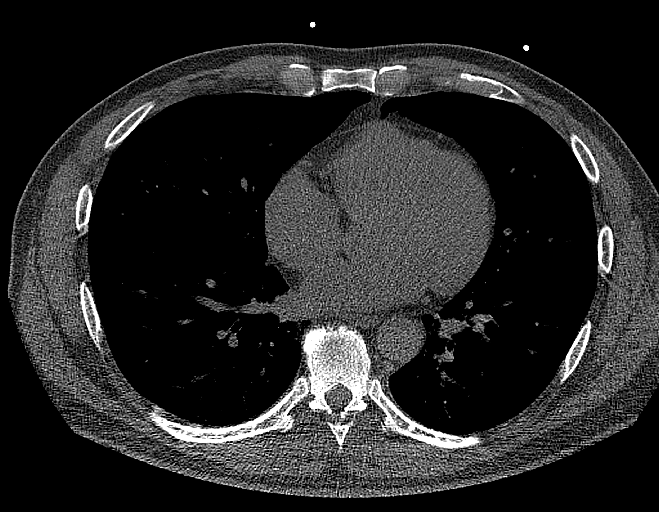
[im 53/80  vessel]
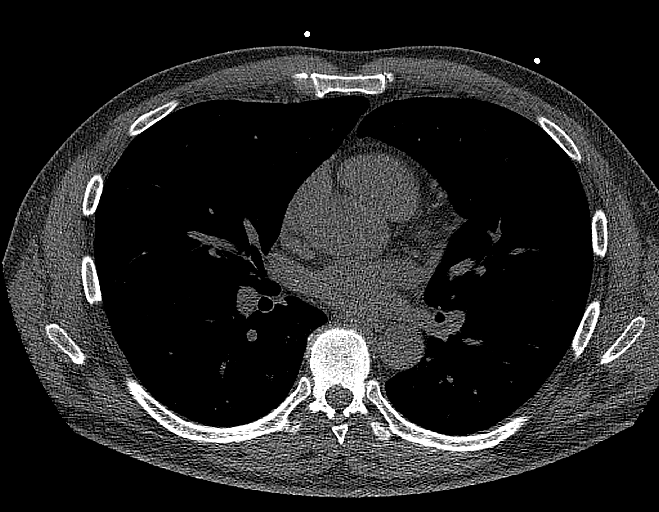
[im 66/80  vessel]
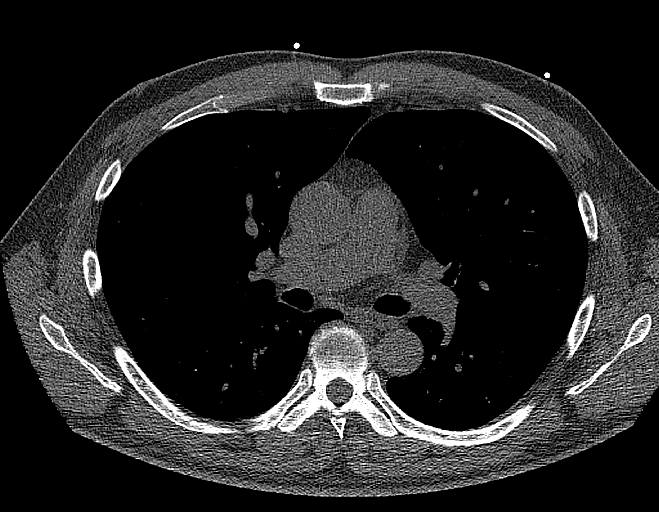

[14 of 20 positions shown; findings below may reference images not displayed]

FINDINGS: CORONARY CALCIUM SCORES:

Left Main: 0

LAD: 0

LCx: 0

RCA: 0

Total Agatston Score: 0

[HOSPITAL] percentile: 0

AORTA MEASUREMENTS:

Ascending Aorta: 34 mm

Descending Aorta: 27 mm

OTHER FINDINGS:

Heart size is normal. No significant pericardial fluid. Visualized
mediastinal structures are normal. Probable small hiatal hernia.
Otherwise, images of the upper abdomen are unremarkable. 2 mm nodule
in the right upper lobe on sequence 9 image 9. Hazy densities in
lower lungs are nonspecific and could represent mild atelectasis or
scarring. No pleural effusions. No large areas of consolidation in
the visualized lungs. No acute bone abnormality.
IMPRESSION: 1. Coronary calcium score is 0.
2. Indeterminate 2 mm nodule in the right upper lobe. No follow-up
needed if patient is low-risk. Non-contrast chest CT can be
considered in 12 months if patient is high-risk. This recommendation
follows the consensus statement: Guidelines for Management of
Incidental Pulmonary Nodules Detected on CT Images: From the

## 2024-01-08 DIAGNOSIS — U071 COVID-19: Secondary | ICD-10-CM | POA: Diagnosis not present

## 2024-01-31 DIAGNOSIS — J019 Acute sinusitis, unspecified: Secondary | ICD-10-CM | POA: Diagnosis not present

## 2024-03-12 DIAGNOSIS — Z0000 Encounter for general adult medical examination without abnormal findings: Secondary | ICD-10-CM | POA: Diagnosis not present

## 2024-03-12 DIAGNOSIS — E781 Pure hyperglyceridemia: Secondary | ICD-10-CM | POA: Diagnosis not present

## 2024-03-12 DIAGNOSIS — I10 Essential (primary) hypertension: Secondary | ICD-10-CM | POA: Diagnosis not present

## 2024-03-12 DIAGNOSIS — E785 Hyperlipidemia, unspecified: Secondary | ICD-10-CM | POA: Diagnosis not present

## 2024-03-12 DIAGNOSIS — Z23 Encounter for immunization: Secondary | ICD-10-CM | POA: Diagnosis not present

## 2024-03-12 DIAGNOSIS — Z125 Encounter for screening for malignant neoplasm of prostate: Secondary | ICD-10-CM | POA: Diagnosis not present
